# Patient Record
Sex: Female | Born: 1990 | Race: White | Hispanic: No | Marital: Single | Smoking: Former smoker
Health system: Southern US, Community
[De-identification: ages and names within clinical notes are randomized; demographics above are authoritative.]

## PROBLEM LIST (undated history)

## (undated) DIAGNOSIS — F329 Major depressive disorder, single episode, unspecified: Secondary | ICD-10-CM

## (undated) DIAGNOSIS — N200 Calculus of kidney: Secondary | ICD-10-CM

## (undated) DIAGNOSIS — N83519 Torsion of ovary and ovarian pedicle, unspecified side: Secondary | ICD-10-CM

## (undated) DIAGNOSIS — F32A Depression, unspecified: Secondary | ICD-10-CM

## (undated) DIAGNOSIS — N83209 Unspecified ovarian cyst, unspecified side: Secondary | ICD-10-CM

## (undated) HISTORY — PX: LAPAROSCOPY: SHX197

## (undated) HISTORY — PX: OVARIAN CYST DRAINAGE: SHX325

---

## 2010-09-02 DIAGNOSIS — N83209 Unspecified ovarian cyst, unspecified side: Secondary | ICD-10-CM

## 2010-09-02 HISTORY — DX: Unspecified ovarian cyst, unspecified side: N83.209

## 2014-08-27 ENCOUNTER — Emergency Department (HOSPITAL_COMMUNITY)
Admission: EM | Admit: 2014-08-27 | Discharge: 2014-08-27 | Disposition: A | Discharge status: Home / Self Care | Payer: BC Managed Care – PPO | Attending: Emergency Medicine | Admitting: Emergency Medicine

## 2014-08-27 ENCOUNTER — Encounter (HOSPITAL_COMMUNITY): Payer: Self-pay | Admitting: Emergency Medicine

## 2014-08-27 DIAGNOSIS — Z720 Tobacco use: Secondary | ICD-10-CM | POA: Insufficient documentation

## 2014-08-27 DIAGNOSIS — S161XXA Strain of muscle, fascia and tendon at neck level, initial encounter: Secondary | ICD-10-CM | POA: Diagnosis not present

## 2014-08-27 DIAGNOSIS — Y998 Other external cause status: Secondary | ICD-10-CM | POA: Insufficient documentation

## 2014-08-27 DIAGNOSIS — S199XXA Unspecified injury of neck, initial encounter: Secondary | ICD-10-CM | POA: Diagnosis present

## 2014-08-27 DIAGNOSIS — Z8742 Personal history of other diseases of the female genital tract: Secondary | ICD-10-CM | POA: Diagnosis not present

## 2014-08-27 DIAGNOSIS — Y9389 Activity, other specified: Secondary | ICD-10-CM | POA: Diagnosis not present

## 2014-08-27 DIAGNOSIS — Y92410 Unspecified street and highway as the place of occurrence of the external cause: Secondary | ICD-10-CM | POA: Insufficient documentation

## 2014-08-27 DIAGNOSIS — S0990XA Unspecified injury of head, initial encounter: Secondary | ICD-10-CM | POA: Diagnosis not present

## 2014-08-27 HISTORY — DX: Unspecified ovarian cyst, unspecified side: N83.209

## 2014-08-27 MED ORDER — CYCLOBENZAPRINE HCL 10 MG PO TABS: 10.0000 mg | ORAL_TABLET | Freq: Two times a day (BID) | ORAL | Status: DC | PRN

## 2014-08-27 MED ORDER — ONDANSETRON 4 MG PO TBDP
4.0000 mg | ORAL_TABLET | Freq: Once | ORAL | Status: AC
  Administered 2014-08-27: 4 mg via ORAL
  Filled 2014-08-27: qty 1

## 2014-08-27 MED ORDER — IBUPROFEN 800 MG PO TABS
800.0000 mg | ORAL_TABLET | Freq: Three times a day (TID) | ORAL | Status: DC
Start: 2014-08-27 — End: 2016-03-14

## 2014-08-27 MED ORDER — ONDANSETRON HCL 4 MG PO TABS: 4.0000 mg | ORAL_TABLET | Freq: Four times a day (QID) | ORAL | Status: DC
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
## 2014-08-27 NOTE — Discharge Instructions (Signed)
Cervical Sprain °A cervical sprain is when the tissues (ligaments) that hold the neck bones in place stretch or tear. °HOME CARE  °· Put ice on the injured area. °¨ Put ice in a plastic bag. °¨ Place a towel between your skin and the bag. °¨ Leave the ice on for 15-20 minutes, 3-4 times a day. °· You may have been given a collar to wear. This collar keeps your neck from moving while you heal. °¨ Do not take the collar off unless told by your doctor. °¨ If you have long hair, keep it outside of the collar. °¨ Ask your doctor before changing the position of your collar. You may need to change its position over time to make it more comfortable. °¨ If you are allowed to take off the collar for cleaning or bathing, follow your doctor's instructions on how to do it safely. °¨ Keep your collar clean by wiping it with mild soap and water. Dry it completely. If the collar has removable pads, remove them every 1-2 days to hand wash them with soap and water. Allow them to air dry. They should be dry before you wear them in the collar. °¨ Do not drive while wearing the collar. °· Only take medicine as told by your doctor. °· Keep all doctor visits as told. °· Keep all physical therapy visits as told. °· Adjust your work station so that you have good posture while you work. °· Avoid positions and activities that make your problems worse. °· Warm up and stretch before being active. °GET HELP IF: °· Your pain is not controlled with medicine. °· You cannot take less pain medicine over time as planned. °· Your activity level does not improve as expected. °GET HELP RIGHT AWAY IF:  °· You are bleeding. °· Your stomach is upset. °· You have an allergic reaction to your medicine. °· You develop new problems that you cannot explain. °· You lose feeling (become numb) or you cannot move any part of your body (paralysis). °· You have tingling or weakness in any part of your body. °· Your symptoms get worse. Symptoms include: °· Pain,  soreness, stiffness, puffiness (swelling), or a burning feeling in your neck. °· Pain when your neck is touched. °· Shoulder or upper back pain. °· Limited ability to move your neck. °· Headache. °· Dizziness. °· Your hands or arms feel week, lose feeling, or tingle. °· Muscle spasms. °· Difficulty swallowing or chewing. °MAKE SURE YOU:  °· Understand these instructions. °· Will watch your condition. °· Will get help right away if you are not doing well or get worse. °Document Released: 02/05/2008 Document Revised: 04/21/2013 Document Reviewed: 02/24/2013 °ExitCare® Patient Information ©2015 [employer]. This information is not intended to replace advice given to you by your health care provider. Make sure you discuss any questions you have with your health care provider. ° °Motor Vehicle Collision °It is common to have multiple bruises and sore muscles after a motor vehicle collision (MVC). These tend to feel worse for the first 24 hours. You may have the most stiffness and soreness over the first several hours. You may also feel worse when you wake up the first morning after your collision. After this point, you will usually begin to improve with each day. The speed of improvement often depends on the severity of the collision, the number of injuries, and the location and nature of these injuries. °HOME CARE INSTRUCTIONS °· Put ice on the injured area. °¨   Put ice in a plastic bag. °¨ Place a towel between your skin and the bag. °¨ Leave the ice on for 15-20 minutes, 3-4 times a day, or as directed by your health care provider. °· Drink enough fluids to keep your urine clear or pale yellow. Do not drink alcohol. °· Take a warm shower or bath once or twice a day. This will increase blood flow to sore muscles. °· You may return to activities as directed by your caregiver. Be careful when lifting, as this may aggravate neck or back pain. °· Only take over-the-counter or prescription medicines for pain, discomfort,  or fever as directed by your caregiver. Do not use aspirin. This may increase bruising and bleeding. °SEEK IMMEDIATE MEDICAL CARE IF: °· You have numbness, tingling, or weakness in the arms or legs. °· You develop severe headaches not relieved with medicine. °· You have severe neck pain, especially tenderness in the middle of the back of your neck. °· You have changes in bowel or bladder control. °· There is increasing pain in any area of the body. °· You have shortness of breath, light-headedness, dizziness, or fainting. °· You have chest pain. °· You feel sick to your stomach (nauseous), throw up (vomit), or sweat. °· You have increasing abdominal discomfort. °· There is blood in your urine, stool, or vomit. °· You have pain in your shoulder (shoulder strap areas). °· You feel your symptoms are getting worse. °MAKE SURE YOU: °· Understand these instructions. °· Will watch your condition. °· Will get help right away if you are not doing well or get worse. °Document Released: 08/19/2005 Document Revised: 01/03/2014 Document Reviewed: 01/16/2011 °ExitCare® Patient Information ©2015 [employer]. This information is not intended to replace advice given to you by your health care provider. Make sure you discuss any questions you have with your health care provider. ° °

## 2015-11-11 ENCOUNTER — Encounter (HOSPITAL_COMMUNITY): Payer: Self-pay | Admitting: Oncology

## 2015-11-11 ENCOUNTER — Emergency Department (HOSPITAL_COMMUNITY): Payer: 59

## 2015-11-11 ENCOUNTER — Emergency Department (HOSPITAL_COMMUNITY)
Admission: EM | Admit: 2015-11-11 | Discharge: 2015-11-11 | Disposition: A | Discharge status: Home / Self Care | Payer: 59 | Attending: Emergency Medicine | Admitting: Emergency Medicine

## 2015-11-11 DIAGNOSIS — N200 Calculus of kidney: Secondary | ICD-10-CM | POA: Diagnosis not present

## 2015-11-11 DIAGNOSIS — N83201 Unspecified ovarian cyst, right side: Secondary | ICD-10-CM | POA: Diagnosis not present

## 2015-11-11 DIAGNOSIS — R1031 Right lower quadrant pain: Secondary | ICD-10-CM | POA: Diagnosis present

## 2015-11-11 DIAGNOSIS — R102 Pelvic and perineal pain unspecified side: Secondary | ICD-10-CM

## 2015-11-11 DIAGNOSIS — F17200 Nicotine dependence, unspecified, uncomplicated: Secondary | ICD-10-CM | POA: Insufficient documentation

## 2015-11-11 DIAGNOSIS — Z3202 Encounter for pregnancy test, result negative: Secondary | ICD-10-CM | POA: Diagnosis not present

## 2015-11-11 LAB — CBC
HEMATOCRIT: 41.4 % (ref 36.0–46.0)
Hemoglobin: 14.2 g/dL (ref 12.0–15.0)
MCH: 31.3 pg (ref 26.0–34.0)
MCHC: 34.3 g/dL (ref 30.0–36.0)
MCV: 91.2 fL (ref 78.0–100.0)
Platelets: 264 10*3/uL (ref 150–400)
RBC: 4.54 MIL/uL (ref 3.87–5.11)
RDW: 12.1 % (ref 11.5–15.5)
WBC: 7.8 10*3/uL (ref 4.0–10.5)

## 2015-11-11 LAB — COMPREHENSIVE METABOLIC PANEL
ALBUMIN: 4.4 g/dL (ref 3.5–5.0)
ALT: 20 U/L (ref 14–54)
AST: 21 U/L (ref 15–41)
Alkaline Phosphatase: 42 U/L (ref 38–126)
Anion gap: 10 (ref 5–15)
BILIRUBIN TOTAL: 0.7 mg/dL (ref 0.3–1.2)
BUN: 16 mg/dL (ref 6–20)
CHLORIDE: 105 mmol/L (ref 101–111)
CO2: 22 mmol/L (ref 22–32)
Calcium: 9.9 mg/dL (ref 8.9–10.3)
Creatinine, Ser: 0.9 mg/dL (ref 0.44–1.00)
GFR calc Af Amer: >60 mL/min (ref >60)
GFR calc non Af Amer: >60 mL/min (ref >60)
GLUCOSE: 78 mg/dL (ref 65–99)
POTASSIUM: 3.6 mmol/L (ref 3.5–5.1)
Sodium: 137 mmol/L (ref 135–145)
Total Protein: 8 g/dL (ref 6.5–8.1)

## 2015-11-11 LAB — URINALYSIS, ROUTINE W REFLEX MICROSCOPIC
BILIRUBIN URINE: NEGATIVE
GLUCOSE, UA: NEGATIVE mg/dL
Ketones, ur: NEGATIVE mg/dL
Nitrite: NEGATIVE
PH: 6.5 (ref 5.0–8.0)
Protein, ur: 30 mg/dL — AB
Specific Gravity, Urine: 1.03 (ref 1.005–1.030)

## 2015-11-11 LAB — URINE MICROSCOPIC-ADD ON

## 2015-11-11 LAB — LIPASE, BLOOD: Lipase: 42 U/L (ref 11–51)

## 2015-11-11 LAB — PREGNANCY, URINE: Preg Test, Ur: NEGATIVE

## 2015-11-11 MED ORDER — KETOROLAC TROMETHAMINE 30 MG/ML IJ SOLN
30.0000 mg | Freq: Once | INTRAMUSCULAR | Status: AC
  Administered 2015-11-11: 30 mg via INTRAVENOUS
  Filled 2015-11-11: qty 1

## 2015-11-11 MED ORDER — HYDROMORPHONE HCL 1 MG/ML IJ SOLN
1.0000 mg | Freq: Once | INTRAMUSCULAR | Status: AC
  Administered 2015-11-11: 1 mg via INTRAVENOUS
  Filled 2015-11-11: qty 1

## 2015-11-11 MED ORDER — HYDROMORPHONE HCL 1 MG/ML IJ SOLN
1.0000 mg | Freq: Once | INTRAMUSCULAR | Status: DC
  Filled 2015-11-11: qty 1

## 2015-11-11 MED ORDER — ONDANSETRON HCL 4 MG/2ML IJ SOLN
4.0000 mg | Freq: Once | INTRAMUSCULAR | Status: AC
  Administered 2015-11-11: 4 mg via INTRAVENOUS
  Filled 2015-11-11: qty 2

## 2015-11-11 MED ORDER — OXYCODONE-ACETAMINOPHEN 5-325 MG PO TABS: 2.0000 | ORAL_TABLET | ORAL | Status: DC | PRN

## 2015-11-11 MED ORDER — ONDANSETRON 4 MG PO TBDP: 4.0000 mg | ORAL_TABLET | Freq: Three times a day (TID) | ORAL | Status: DC | PRN

## 2015-11-11 MED ORDER — HYDROMORPHONE HCL 1 MG/ML IJ SOLN
1.0000 mg | Freq: Once | INTRAMUSCULAR | Status: AC
  Administered 2015-11-11: 1 mg via INTRAVENOUS

## 2015-11-11 MED ORDER — TAMSULOSIN HCL 0.4 MG PO CAPS: 0.4000 mg | ORAL_CAPSULE | Freq: Every day | ORAL | Status: DC

## 2015-11-11 MED ORDER — IOHEXOL 300 MG/ML  SOLN
100.0000 mL | Freq: Once | INTRAMUSCULAR | Status: AC | PRN
  Administered 2015-11-11: 100 mL via INTRAVENOUS

## 2015-11-11 MED ORDER — ONDANSETRON HCL 4 MG/2ML IJ SOLN
4.0000 mg | Freq: Once | INTRAMUSCULAR | Status: AC
  Administered 2015-11-11: 4 mg via INTRAMUSCULAR
  Filled 2015-11-11: qty 2

## 2015-11-11 MED ORDER — IOHEXOL 300 MG/ML  SOLN
50.0000 mL | Freq: Once | INTRAMUSCULAR | Status: AC | PRN
  Administered 2015-11-11: 50 mL via ORAL

## 2015-11-11 NOTE — ED Provider Notes (Signed)
CSN: [id number]     Arrival date & time 11/11/15  0250 History   First MD Initiated Contact with Patient 11/11/15 [phone]     Chief Complaint  Patient presents with  . Abdominal Pain     (Consider location/radiation/quality/duration/timing/severity/associated sxs/prior Treatment) Patient is a 25 y.o. female presenting with abdominal pain. The history is provided by the patient. No language interpreter was used.  Abdominal Pain Pain location:  RLQ Pain quality: aching and stabbing   Pain radiates to:  RLQ Pain severity:  Severe Onset quality:  Sudden Timing:  Constant Progression:  Worsening Chronicity:  New Context: alcohol use and previous surgery   Relieved by:  Nothing Worsened by:  Nothing tried Ineffective treatments:  None tried Risk factors: no recent hospitalization   Pt reports she has had an ovarain torsion in the past and pain is similiar  Past Medical History  Diagnosis Date  . Ovarian cyst 2012   History reviewed. No pertinent past surgical history. No family history on file. Social History  Substance Use Topics  . Smoking status: Current Some Day Smoker  . Smokeless tobacco: Never Used  . Alcohol Use: Yes   OB History    No data available     Review of Systems  Gastrointestinal: Positive for abdominal pain.  All other systems reviewed and are negative.     Allergies  Review of patient's allergies indicates no known allergies.  Home Medications   Prior to Admission medications   Medication Sig Start Date End Date Taking? Authorizing Provider  cyclobenzaprine (FLEXERIL) 10 MG tablet Take 1 tablet (10 mg total) by mouth 2 (two) times daily as needed for muscle spasms. Patient not taking: Reported on 11/11/2015 08/27/14   Elpidio Anis, PA-C  ibuprofen (ADVIL,MOTRIN) 800 MG tablet Take 1 tablet (800 mg total) by mouth 3 (three) times daily. Patient not taking: Reported on 11/11/2015 08/27/14   Elpidio Anis, PA-C  ondansetron (ZOFRAN) 4 MG tablet Take  1 tablet (4 mg total) by mouth every 6 (six) hours. Patient not taking: Reported on 11/11/2015 08/27/14   Elpidio Anis, PA-C   BP 123/70 mmHg  Pulse 76  Temp(Src) 98.4 F (36.9 C) (Oral)  Resp 20  Ht  (1.626 m)  Wt 52.164 kg  BMI 19.73 kg/m2  SpO2 100%  LMP 10/21/2015 (Approximate) Physical Exam  Constitutional: She is oriented to person, place, and time. She appears well-developed and well-nourished.  HENT:  Head: Normocephalic and atraumatic.  Right Ear: External ear normal.  Left Ear: External ear normal.  Mouth/Throat: Oropharynx is clear and moist.  Eyes: Conjunctivae and EOM are normal. Pupils are equal, round, and reactive to light.  Neck: Normal range of motion.  Cardiovascular: Normal rate and normal heart sounds.   Pulmonary/Chest: Effort normal.  Abdominal: Soft. She exhibits no distension.  Musculoskeletal: Normal range of motion.  Neurological: She is alert and oriented to person, place, and time.  Skin: Skin is warm.  Psychiatric: She has a normal mood and affect.  Nursing note and vitals reviewed.   ED Course  Procedures (including critical care time) Labs Review Labs Reviewed  URINALYSIS, ROUTINE W REFLEX MICROSCOPIC (NOT AT [facility]) - Abnormal; Notable for the following:    APPearance CLOUDY (*)    Hgb urine dipstick SMALL (*)    Protein, ur 30 (*)    Leukocytes, UA SMALL (*)    All other components within normal limits  URINE MICROSCOPIC-ADD ON - Abnormal; Notable for the following:  Squamous Epithelial / LPF 6-30 (*)    Bacteria, UA MANY (*)    All other components within normal limits  LIPASE, BLOOD  COMPREHENSIVE METABOLIC PANEL  CBC  PREGNANCY, URINE    Imaging Review US Transvaginal Non-ob  11/11/2015  CLINICAL DATA:  Right lower quadrant pain. History of torsion and cysts. EXAM: TRANSABDOMINAL AND TRANSVAGINAL ULTRASOUND OF PELVIS DOPPLER ULTRASOUND OF OVARIES TECHNIQUE: Both transabdominal and transvaginal ultrasound examinations of  the pelvis were performed. Transabdominal technique was performed for global imaging of the pelvis including uterus, ovaries, adnexal regions, and pelvic cul-de-sac. It was necessary to proceed with endovaginal exam following the transabdominal exam to visualize the ovaries. Color and duplex Doppler ultrasound was utilized to evaluate blood flow to the ovaries. COMPARISON:  None. FINDINGS: Uterus Measurements: 6 x 3 x 5 cm. No fibroids or other mass visualized. Endometrium Thickness: 10 mm.  No focal abnormality visualized. Right ovary Measurements: 45 x 28 x 34 mm. Crenulated/collapsed cystic structure measuring 14 mm with mid level internal echoes. Left ovary Measurements: 23 x 20 x 22 mm. Normal appearance/no adnexal mass. Pulsed Doppler evaluation of both ovaries demonstrates normal low-resistance arterial and venous waveforms. Other findings Small pelvic fluid, mainly simple. IMPRESSION: 1. 14 mm hemorrhagic cyst/corpus luteum on the right. Partially collapsed appearance may indicate recent rupture. 2. Negative for ovarian torsion. Electronically Signed   By: Marnee Spring M.D.   On: 11/11/2015 04:55   US Pelvis Complete  11/11/2015  CLINICAL DATA:  Right lower quadrant pain. History of torsion and cysts. EXAM: TRANSABDOMINAL AND TRANSVAGINAL ULTRASOUND OF PELVIS DOPPLER ULTRASOUND OF OVARIES TECHNIQUE: Both transabdominal and transvaginal ultrasound examinations of the pelvis were performed. Transabdominal technique was performed for global imaging of the pelvis including uterus, ovaries, adnexal regions, and pelvic cul-de-sac. It was necessary to proceed with endovaginal exam following the transabdominal exam to visualize the ovaries. Color and duplex Doppler ultrasound was utilized to evaluate blood flow to the ovaries. COMPARISON:  None. FINDINGS: Uterus Measurements: 6 x 3 x 5 cm. No fibroids or other mass visualized. Endometrium Thickness: 10 mm.  No focal abnormality visualized. Right ovary  Measurements: 45 x 28 x 34 mm. Crenulated/collapsed cystic structure measuring 14 mm with mid level internal echoes. Left ovary Measurements: 23 x 20 x 22 mm. Normal appearance/no adnexal mass. Pulsed Doppler evaluation of both ovaries demonstrates normal low-resistance arterial and venous waveforms. Other findings Small pelvic fluid, mainly simple. IMPRESSION: 1. 14 mm hemorrhagic cyst/corpus luteum on the right. Partially collapsed appearance may indicate recent rupture. 2. Negative for ovarian torsion. Electronically Signed   By: Marnee Spring M.D.   On: 11/11/2015 04:55   Ct Abdomen Pelvis W Contrast  11/11/2015  CLINICAL DATA:  Right lower quadrant pain EXAM: CT ABDOMEN AND PELVIS WITH CONTRAST TECHNIQUE: Multidetector CT imaging of the abdomen and pelvis was performed using the standard protocol following bolus administration of intravenous contrast. CONTRAST:  50mL OMNIPAQUE IOHEXOL 300 MG/ML SOLN, OMNIPAQUE IOHEXOL 300 MG/ML SOLN COMPARISON:  None. FINDINGS: Lower chest and abdominal wall:  No contributory findings. Hepatobiliary: No focal liver abnormality.No evidence of biliary obstruction or stone. Pancreas: Unremarkable. Spleen: Unremarkable. Adrenals/Urinary Tract:  Negative adrenals. 2 mm stone at the right UVJ with mild hydronephrosis. 2 left renal calculi up to 4 mm. No left hydronephrosis. Unremarkable bladder. Reproductive:Right corpus luteum as reported on prior sonography. Stomach/Bowel: No obstruction. Appendix not well seen but no secondary signs of appendicitis. Vascular/Lymphatic: No acute vascular abnormality. No mass or adenopathy. Peritoneal: No  ascites or pneumoperitoneum. Musculoskeletal: No acute abnormalities. IMPRESSION: 1. 2 mm right UVJ calculus with mild hydronephrosis. 2. Left nephrolithiasis. Electronically Signed   By: Marnee Spring M.D.   On: 11/11/2015 06:29   [place] Art/ven Flow Abd Pelv Doppler  11/11/2015  CLINICAL DATA:  Right lower quadrant pain. History of  torsion and cysts. EXAM: TRANSABDOMINAL AND TRANSVAGINAL ULTRASOUND OF PELVIS DOPPLER ULTRASOUND OF OVARIES TECHNIQUE: Both transabdominal and transvaginal ultrasound examinations of the pelvis were performed. Transabdominal technique was performed for global imaging of the pelvis including uterus, ovaries, adnexal regions, and pelvic cul-de-sac. It was necessary to proceed with endovaginal exam following the transabdominal exam to visualize the ovaries. Color and duplex Doppler ultrasound was utilized to evaluate blood flow to the ovaries. COMPARISON:  None. FINDINGS: Uterus Measurements: 6 x 3 x 5 cm. No fibroids or other mass visualized. Endometrium Thickness: 10 mm.  No focal abnormality visualized. Right ovary Measurements: 45 x 28 x 34 mm. Crenulated/collapsed cystic structure measuring 14 mm with mid level internal echoes. Left ovary Measurements: 23 x 20 x 22 mm. Normal appearance/no adnexal mass. Pulsed Doppler evaluation of both ovaries demonstrates normal low-resistance arterial and venous waveforms. Other findings Small pelvic fluid, mainly simple. IMPRESSION: 1. 14 mm hemorrhagic cyst/corpus luteum on the right. Partially collapsed appearance may indicate recent rupture. 2. Negative for ovarian torsion. Electronically Signed   By: Marnee Spring M.D.   On: 11/11/2015 04:55   I have personally reviewed and evaluated these images and lab results as part of my medical decision-making.   EKG Interpretation None      MDM ultrasound shows small collapsing cyst.   Ct abdomen ordered to evaluate for possible appendicitis.  Pt's care turned over to [facility].  Ct scan pending   Final diagnoses:  Kidney stone on right side  Ovarian cyst, right   An After Visit Summary was printed and given to the patient. Meds ordered this encounter  Medications  . HYDROmorphone (DILAUDID) injection 1 mg    Sig:   . ondansetron (ZOFRAN) injection 4 mg    Sig:   . DISCONTD: HYDROmorphone (DILAUDID)  injection 1 mg    Sig:   . HYDROmorphone (DILAUDID) injection 1 mg    Sig:   . iohexol (OMNIPAQUE) 300 MG/ML solution 50 mL    Sig:   . iohexol (OMNIPAQUE) 300 MG/ML solution 100 mL    Sig:   . HYDROmorphone (DILAUDID) injection 1 mg    Sig:   . ondansetron (ZOFRAN) injection 4 mg    Sig:   . ketorolac (TORADOL) 30 MG/ML injection 30 mg    Sig:   . oxyCODONE-acetaminophen (PERCOCET/ROXICET) 5-325 MG tablet    Sig: Take 2 tablets by mouth every 4 (four) hours as needed for severe pain.    Dispense:  15 tablet    Refill:  0    Order Specific Question:  Supervising Provider    Answer:  MILLER, BRIAN [[id number]]  . tamsulosin (FLOMAX) 0.4 MG CAPS capsule    Sig: Take 1 capsule (0.4 mg total) by mouth daily.    Dispense:  15 capsule    Refill:  0    Order Specific Question:  Supervising Provider    Answer:  MILLER, BRIAN [[id number]]  . ondansetron (ZOFRAN ODT) 4 MG disintegrating tablet    Sig: Take 1 tablet (4 mg total) by mouth every 8 (eight) hours as needed for nausea or vomiting.    Dispense:  20 tablet    Refill:  0    Order Specific Question:  Supervising Provider    Answer:  Eber HongMILLER, BRIAN [[id number]]     Lonia SkinnerLeslie K HillcrestSofia, PA-C 11/11/15 [phone]  April Palumbo, MD 11/11/15 [phone]

## 2015-11-11 NOTE — Discharge Instructions (Signed)
Ovarian Cyst An ovarian cyst is a fluid-filled sac that forms on an ovary. The ovaries are small organs that produce eggs in women. Various types of cysts can form on the ovaries. Most are not cancerous. Many do not cause problems, and they often go away on their own. Some may cause symptoms and require treatment. Common types of ovarian cysts include:  Functional cysts--These cysts may occur every month during the menstrual cycle. This is normal. The cysts usually go away with the next menstrual cycle if the woman does not get pregnant. Usually, there are no symptoms with a functional cyst.  Endometrioma cysts--These cysts form from the tissue that lines the uterus. They are also called "chocolate cysts" because they become filled with blood that turns brown. This type of cyst can cause pain in the lower abdomen during intercourse and with your menstrual period.  Cystadenoma cysts--This type develops from the cells on the outside of the ovary. These cysts can get very big and cause lower abdomen pain and pain with intercourse. This type of cyst can twist on itself, cut off its blood supply, and cause severe pain. It can also easily rupture and cause a lot of pain.  Dermoid cysts--This type of cyst is sometimes found in both ovaries. These cysts may contain different kinds of body tissue, such as skin, teeth, hair, or cartilage. They usually do not cause symptoms unless they get very big.  Theca lutein cysts--These cysts occur when too much of a certain hormone (human chorionic gonadotropin) is produced and overstimulates the ovaries to produce an egg. This is most common after procedures used to assist with the conception of a baby (in vitro fertilization). CAUSES   Fertility drugs can cause a condition in which multiple large cysts are formed on the ovaries. This is called ovarian hyperstimulation syndrome.  A condition called polycystic ovary syndrome can cause hormonal imbalances that can lead to  nonfunctional ovarian cysts. SIGNS AND SYMPTOMS  Many ovarian cysts do not cause symptoms. If symptoms are present, they may include:  Pelvic pain or pressure.  Pain in the lower abdomen.  Pain during sexual intercourse.  Increasing girth (swelling) of the abdomen.  Abnormal menstrual periods.  Increasing pain with menstrual periods.  Stopping having menstrual periods without being pregnant. DIAGNOSIS  These cysts are commonly found during a routine or annual pelvic exam. Tests may be ordered to find out more about the cyst. These tests may include:  Ultrasound.  X-ray of the pelvis.  CT scan.  MRI.  Blood tests. TREATMENT  Many ovarian cysts go away on their own without treatment. Your health care provider may want to check your cyst regularly for 2-3 months to see if it changes. For women in menopause, it is particularly important to monitor a cyst closely because of the higher rate of ovarian cancer in menopausal women. When treatment is needed, it may include any of the following:  A procedure to drain the cyst (aspiration). This may be done using a long needle and ultrasound. It can also be done through a laparoscopic procedure. This involves using a thin, lighted tube with a tiny camera on the end (laparoscope) inserted through a small incision.  Surgery to remove the whole cyst. This may be done using laparoscopic surgery or an open surgery involving a larger incision in the lower abdomen.  Hormone treatment or birth control pills. These methods are sometimes used to help dissolve a cyst. HOME CARE INSTRUCTIONS   Only take over-the-counter  or prescription medicines as directed by your health care provider.  Follow up with your health care provider as directed.  Get regular pelvic exams and Pap tests. SEEK MEDICAL CARE IF:   Your periods are late, irregular, or painful, or they stop.  Your pelvic pain or abdominal pain does not go away.  Your abdomen becomes  larger or swollen.  You have pressure on your bladder or trouble emptying your bladder completely.  You have pain during sexual intercourse.  You have feelings of fullness, pressure, or discomfort in your stomach.  You lose weight for no apparent reason.  You feel generally ill.  You become constipated.  You lose your appetite.  You develop acne.  You have an increase in body and facial hair.  You are gaining weight, without changing your exercise and eating habits.  You think you are pregnant. SEEK IMMEDIATE MEDICAL CARE IF:   You have increasing abdominal pain.  You feel sick to your stomach (nauseous), and you throw up (vomit).  You develop a fever that comes on suddenly.  You have abdominal pain during a bowel movement.  Your menstrual periods become heavier than usual. MAKE SURE YOU:  Understand these instructions.  Will watch your condition.  Will get help right away if you are not doing well or get worse.   This information is not intended to replace advice given to you by your health care provider. Make sure you discuss any questions you have with your health care provider.   Document Released: 08/19/2005 Document Revised: 08/24/2013 Document Reviewed: 04/26/2013 Elsevier Interactive Patient Education 2016 [employer] Kidney Stones Kidney stones (urolithiasis) are deposits that form inside your kidneys. The intense pain is caused by the stone moving through the urinary tract. When the stone moves, the ureter goes into spasm around the stone. The stone is usually passed in the urine.  CAUSES   A disorder that makes certain neck glands produce too much parathyroid hormone (primary hyperparathyroidism).  A buildup of uric acid crystals, similar to gout in your joints.  Narrowing (stricture) of the ureter.  A kidney obstruction present at birth (congenital obstruction).  Previous surgery on the kidney or ureters.  Numerous kidney  infections. SYMPTOMS   Feeling sick to your stomach (nauseous).  Throwing up (vomiting).  Blood in the urine (hematuria).  Pain that usually spreads (radiates) to the groin.  Frequency or urgency of urination. DIAGNOSIS   Taking a history and physical exam.  Blood or urine tests.  CT scan.  Occasionally, an examination of the inside of the urinary bladder (cystoscopy) is performed. TREATMENT   Observation.  Increasing your fluid intake.  Extracorporeal shock wave lithotripsy--This is a noninvasive procedure that uses shock waves to break up kidney stones.  Surgery may be needed if you have severe pain or persistent obstruction. There are various surgical procedures. Most of the procedures are performed with the use of small instruments. Only small incisions are needed to accommodate these instruments, so recovery time is minimized. The size, location, and chemical composition are all important variables that will determine the proper choice of action for you. Talk to your health care provider to better understand your situation so that you will minimize the risk of injury to yourself and your kidney.  HOME CARE INSTRUCTIONS   Drink enough water and fluids to keep your urine clear or pale yellow. This will help you to pass the stone or stone fragments.  Strain all urine through the provided strainer. Keep  all particulate matter and stones for your health care provider to see. The stone causing the pain may be as small as a grain of salt. It is very important to use the strainer each and every time you pass your urine. The collection of your stone will allow your health care provider to analyze it and verify that a stone has actually passed. The stone analysis will often identify what you can do to reduce the incidence of recurrences.  Only take over-the-counter or prescription medicines for pain, discomfort, or fever as directed by your health care provider.  Keep all follow-up  visits as told by your health care provider. This is important.  Get follow-up X-rays if required. The absence of pain does not always mean that the stone has passed. It may have only stopped moving. If the urine remains completely obstructed, it can cause loss of kidney function or even complete destruction of the kidney. It is your responsibility to make sure X-rays and follow-ups are completed. Ultrasounds of the kidney can show blockages and the status of the kidney. Ultrasounds are not associated with any radiation and can be performed easily in a matter of minutes.  Make changes to your daily diet as told by your health care provider. You may be told to:  Limit the amount of salt that you eat.  Eat 5 or more servings of fruits and vegetables each day.  Limit the amount of meat, poultry, fish, and eggs that you eat.  Collect a 24-hour urine sample as told by your health care provider.You may need to collect another urine sample every 6-12 months. SEEK MEDICAL CARE IF:  You experience pain that is progressive and unresponsive to any pain medicine you have been prescribed. SEEK IMMEDIATE MEDICAL CARE IF:   Pain cannot be controlled with the prescribed medicine.  You have a fever or shaking chills.  The severity or intensity of pain increases over 18 hours and is not relieved by pain medicine.  You develop a new onset of abdominal pain.  You feel faint or pass out.  You are unable to urinate.   This information is not intended to replace advice given to you by your health care provider. Make sure you discuss any questions you have with your health care provider.   Document Released: 08/19/2005 Document Revised: 05/10/2015 Document Reviewed: 01/20/2013 Elsevier Interactive Patient Education [employer]

## 2015-11-11 NOTE — ED Notes (Signed)
Pt c/o RLQ abdominal pain.  Pt w/ known hx of ovarian cysts.  Pt rates pain 8/10, sharp intermittently, dull aching constantly.

## 2015-11-11 NOTE — ED Notes (Signed)
Ultrasound completed

## 2015-11-11 NOTE — ED Notes (Signed)
Patient transported to CT 

## 2016-03-14 ENCOUNTER — Encounter (HOSPITAL_COMMUNITY): Payer: Self-pay | Admitting: Emergency Medicine

## 2016-03-14 ENCOUNTER — Emergency Department (HOSPITAL_COMMUNITY): Payer: No Typology Code available for payment source

## 2016-03-14 ENCOUNTER — Emergency Department (HOSPITAL_COMMUNITY)
Admission: EM | Admit: 2016-03-14 | Discharge: 2016-03-14 | Disposition: A | Discharge status: Home / Self Care | Payer: No Typology Code available for payment source | Attending: Emergency Medicine | Admitting: Emergency Medicine

## 2016-03-14 DIAGNOSIS — Y9289 Other specified places as the place of occurrence of the external cause: Secondary | ICD-10-CM | POA: Insufficient documentation

## 2016-03-14 DIAGNOSIS — W01198A Fall on same level from slipping, tripping and stumbling with subsequent striking against other object, initial encounter: Secondary | ICD-10-CM | POA: Insufficient documentation

## 2016-03-14 DIAGNOSIS — F17200 Nicotine dependence, unspecified, uncomplicated: Secondary | ICD-10-CM | POA: Diagnosis not present

## 2016-03-14 DIAGNOSIS — R55 Syncope and collapse: Secondary | ICD-10-CM | POA: Diagnosis present

## 2016-03-14 DIAGNOSIS — S060X1A Concussion with loss of consciousness of 30 minutes or less, initial encounter: Secondary | ICD-10-CM | POA: Diagnosis not present

## 2016-03-14 DIAGNOSIS — Y999 Unspecified external cause status: Secondary | ICD-10-CM | POA: Diagnosis not present

## 2016-03-14 DIAGNOSIS — Y939 Activity, unspecified: Secondary | ICD-10-CM | POA: Insufficient documentation

## 2016-03-14 DIAGNOSIS — H7292 Unspecified perforation of tympanic membrane, left ear: Secondary | ICD-10-CM | POA: Insufficient documentation

## 2016-03-14 DIAGNOSIS — Z79899 Other long term (current) drug therapy: Secondary | ICD-10-CM | POA: Insufficient documentation

## 2016-03-14 HISTORY — DX: Calculus of kidney: N20.0

## 2016-03-14 MED ORDER — APAP 325 MG PO TABS: 650.0000 mg | ORAL_TABLET | Freq: Four times a day (QID) | ORAL | Status: DC | PRN

## 2016-03-14 MED ORDER — ONDANSETRON 4 MG PO TBDP: 4.0000 mg | ORAL_TABLET | Freq: Three times a day (TID) | ORAL | Status: DC | PRN

## 2016-03-14 MED ORDER — ONDANSETRON 4 MG PO TBDP
4.0000 mg | ORAL_TABLET | Freq: Once | ORAL | Status: AC
  Administered 2016-03-14: 4 mg via ORAL
  Filled 2016-03-14: qty 1

## 2016-03-14 MED ORDER — NAPROXEN 250 MG PO TABS: 250.0000 mg | ORAL_TABLET | Freq: Two times a day (BID) | ORAL | Status: DC

## 2016-03-14 MED ORDER — ACETAMINOPHEN 325 MG PO TABS
650.0000 mg | ORAL_TABLET | Freq: Once | ORAL | Status: AC
  Administered 2016-03-14: 650 mg via ORAL
  Filled 2016-03-14: qty 2

## 2016-03-14 NOTE — ED Notes (Signed)
MD at bedside. 

## 2016-03-14 NOTE — ED Notes (Signed)
[facility] was [facility] here [facility]. Pt had fall last night hitting the left side of her face/head with positive LOC. MD at [facility] concerned for perforated TM. Pt also reports nausea since this morning and left sided HA. Pt a/o x 4.

## 2016-03-14 NOTE — Discharge Instructions (Signed)
Concussion, Adult A concussion, or closed-head injury, is a brain injury caused by a direct blow to the head or by a quick and sudden movement (jolt) of the head or neck. Concussions are usually not life-threatening. Even so, the effects of a concussion can be serious. If you have had a concussion before, you are more likely to experience concussion-like symptoms after a direct blow to the head.  CAUSES  Direct blow to the head, such as from running into another player during a soccer game, being hit in a fight, or hitting your head on a hard surface.  A jolt of the head or neck that causes the brain to move back and forth inside the skull, such as in a car crash. SIGNS AND SYMPTOMS The signs of a concussion can be hard to notice. Early on, they may be missed by you, family members, and health care providers. You may look fine but act or feel differently. Symptoms are usually temporary, but they may last for days, weeks, or even longer. Some symptoms may appear right away while others may not show up for hours or days. Every head injury is different. Symptoms include:  Mild to moderate headaches that will not go away.  A feeling of pressure inside your head.  Having more trouble than usual:  Learning or remembering things you have heard.  Answering questions.  Paying attention or concentrating.  Organizing daily tasks.  Making decisions and solving problems.  Slowness in thinking, acting or reacting, speaking, or reading.  Getting lost or being easily confused.  Feeling tired all the time or lacking energy (fatigued).  Feeling drowsy.  Sleep disturbances.  Sleeping more than usual.  Sleeping less than usual.  Trouble falling asleep.  Trouble sleeping (insomnia).  Loss of balance or feeling lightheaded or dizzy.  Nausea or vomiting.  Numbness or tingling.  Increased sensitivity to:  Sounds.  Lights.  Distractions.  Vision problems or eyes that tire  easily.  Diminished sense of taste or smell.  Ringing in the ears.  Mood changes such as feeling sad or anxious.  Becoming easily irritated or angry for little or no reason.  Lack of motivation.  Seeing or hearing things other people do not see or hear (hallucinations). DIAGNOSIS Your health care provider can usually diagnose a concussion based on a description of your injury and symptoms. He or she will ask whether you passed out (lost consciousness) and whether you are having trouble remembering events that happened right before and during your injury. Your evaluation might include:  A brain scan to look for signs of injury to the brain. Even if the test shows no injury, you may still have a concussion.  Blood tests to be sure other problems are not present. TREATMENT  Concussions are usually treated in an emergency department, in urgent care, or at a clinic. You may need to stay in the hospital overnight for further treatment.  Tell your health care provider if you are taking any medicines, including prescription medicines, over-the-counter medicines, and natural remedies. Some medicines, such as blood thinners (anticoagulants) and aspirin, may increase the chance of complications. Also tell your health care provider whether you have had alcohol or are taking illegal drugs. This information may affect treatment.  Your health care provider will send you home with important instructions to follow.  How fast you will recover from a concussion depends on many factors. These factors include how severe your concussion is, what part of your brain was injured,  your age, and how healthy you were before the concussion.  Most people with mild injuries recover fully. Recovery can take time. In general, recovery is slower in older persons. Also, persons who have had a concussion in the past or have other medical problems may find that it takes longer to recover from their current injury. HOME  CARE INSTRUCTIONS General Instructions  Carefully follow the directions your health care provider gave you.  Only take over-the-counter or prescription medicines for pain, discomfort, or fever as directed by your health care provider.  Take only those medicines that your health care provider has approved.  Do not drink alcohol until your health care provider says you are well enough to do so. Alcohol and certain other drugs may slow your recovery and can put you at risk of further injury.  If it is harder than usual to remember things, write them down.  If you are easily distracted, try to do one thing at a time. For example, do not try to watch TV while fixing dinner.  Talk with family members or close friends when making important decisions.  Keep all follow-up appointments. Repeated evaluation of your symptoms is recommended for your recovery.  Watch your symptoms and tell others to do the same. Complications sometimes occur after a concussion. Older adults with a brain injury may have a higher risk of serious complications, such as a blood clot on the brain.  Tell your teachers, school nurse, school counselor, coach, athletic trainer, or work Freight forwarder about your injury, symptoms, and restrictions. Tell them about what you can or cannot do. They should watch for:  Increased problems with attention or concentration.  Increased difficulty remembering or learning new information.  Increased time needed to complete tasks or assignments.  Increased irritability or decreased ability to cope with stress.  Increased symptoms.  Rest. Rest helps the brain to heal. Make sure you:  Get plenty of sleep at night. Avoid staying up late at night.  Keep the same bedtime hours on weekends and weekdays.  Rest during the day. Take daytime naps or rest breaks when you feel tired.  Limit activities that require a lot of thought or concentration. These include:  Doing homework or job-related  work.  Watching TV.  Working on the computer.  Avoid any situation where there is potential for another head injury (football, hockey, soccer, basketball, martial arts, downhill snow sports and horseback riding). Your condition will get worse every time you experience a concussion. You should avoid these activities until you are evaluated by the appropriate follow-up health care providers. Returning To Your Regular Activities You will need to return to your normal activities slowly, not all at once. You must give your body and brain enough time for recovery.  Do not return to sports or other athletic activities until your health care provider tells you it is safe to do so.  Ask your health care provider when you can drive, ride a bicycle, or operate heavy machinery. Your ability to react may be slower after a brain injury. Never do these activities if you are dizzy.  Ask your health care provider about when you can return to work or school. Preventing Another Concussion It is very important to avoid another brain injury, especially before you have recovered. In rare cases, another injury can lead to permanent brain damage, brain swelling, or death. The risk of this is greatest during the first 7-10 days after a head injury. Avoid injuries by:  Wearing a  seat belt when riding in a car.  Drinking alcohol only in moderation.  Wearing a helmet when biking, skiing, skateboarding, skating, or doing similar activities.  Avoiding activities that could lead to a second concussion, such as contact or recreational sports, until your health care provider says it is okay.  Taking safety measures in your home.  Remove clutter and tripping hazards from floors and stairways.  Use grab bars in bathrooms and handrails by stairs.  Place non-slip mats on floors and in bathtubs.  Improve lighting in dim areas. SEEK MEDICAL CARE IF:  You have increased problems paying attention or  concentrating.  You have increased difficulty remembering or learning new information.  You need more time to complete tasks or assignments than before.  You have increased irritability or decreased ability to cope with stress.  You have more symptoms than before. Seek medical care if you have any of the following symptoms for more than 2 weeks after your injury:  Lasting (chronic) headaches.  Dizziness or balance problems.  Nausea.  Vision problems.  Increased sensitivity to noise or light.  Depression or mood swings.  Anxiety or irritability.  Memory problems.  Difficulty concentrating or paying attention.  Sleep problems.  Feeling tired all the time. SEEK IMMEDIATE MEDICAL CARE IF:  You have severe or worsening headaches. These may be a sign of a blood clot in the brain.  You have weakness (even if only in one hand, leg, or part of the face).  You have numbness.  You have decreased coordination.  You vomit repeatedly.  You have increased sleepiness.  One pupil is larger than the other.  You have convulsions.  You have slurred speech.  You have increased confusion. This may be a sign of a blood clot in the brain.  You have increased restlessness, agitation, or irritability.  You are unable to recognize people or places.  You have neck pain.  It is difficult to wake you up.  You have unusual behavior changes.  You lose consciousness. MAKE SURE YOU:  Understand these instructions.  Will watch your condition.  Will get help right away if you are not doing well or get worse.   This information is not intended to replace advice given to you by your health care provider. Make sure you discuss any questions you have with your health care provider.   Document Released: 11/09/2003 Document Revised: 09/09/2014 Document Reviewed: 03/11/2013 Elsevier Interactive Patient Education 2016 [employer]  Eardrum Perforation An eardrum perforation is a  puncture or tear in the eardrum. This is also called a ruptured eardrum. The eardrum is a thin, round tissue inside of your ear that separates your ear canal from your middle ear. This is the tissue that detects sound and enables you to hear. An eardrum perforation can cause discomfort and hearing loss. In most cases, the eardrum will heal without treatment and with little or no permanent hearing loss. CAUSES An eardrum perforation can result from different causes, including:  Sudden pressure changes that happen in situations such as scuba diving or flying in an airplane.  Foreign objects in the ear.  Inserting a cotton-tipped swab or any blunt object into the ear.  Loud noise.  Trauma to the ear.  Attempting to remove an object from the ear. SIGNS AND SYMPTOMS  Hearing loss.  Ear pain.  Ringing in the ear.  Discharge or bleeding from the ear.  Dizziness.  Vomiting.  Facial paralysis. DIAGNOSIS  Your health care provider will  examine your ear using a tool called an otoscope. This tool allows the health care provider to see into your ear to examine your eardrum. Various types of hearing tests may also be done. TREATMENT  Typically, the eardrum will heal on its own within a few weeks. If your eardrum does not heal, your health care provider may recommend one of the following treatments:  A procedure to place a patch over your eardrum.  Surgery to repair your eardrum. HOME CARE INSTRUCTIONS   Keep your ear dry. This will improve healing. Do not submerge your head under water until healing is complete. Do not swim or dive until your health care provider approves. While bathing or showering, protect your ear using one of these methods:  Using a waterproof earplug.  Covering a piece of cotton with petroleum jelly and placing it in your outer ear canal.  Take medicines only as directed by your health care provider.  Avoid blowing your nose if possible. If you blow your nose,  do it gently. Forceful blowing increases the pressure in your middle ear. This may cause further injury or may delay your healing.  Resume your normal activities after the perforation has healed. Your health care provider can tell you when this has occurred.  Talk to your health care provider before you fly on an airplane. Air travel is generally allowed with a perforated eardrum.  Keep all follow-up visits as directed by your health care provider. This is important. SEEK MEDICAL CARE IF:  You have a fever. SEEK IMMEDIATE MEDICAL CARE IF:  You have blood or pus coming from your ear.  You have dizziness or problems with balance.  You have nausea or vomiting.  You have increased pain.   This information is not intended to replace advice given to you by your health care provider. Make sure you discuss any questions you have with your health care provider.   Document Released: 08/16/2000 Document Revised: 09/09/2014 Document Reviewed: 03/28/2014 Elsevier Interactive Patient Education [employer]
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
## 2016-10-08 ENCOUNTER — Encounter (HOSPITAL_BASED_OUTPATIENT_CLINIC_OR_DEPARTMENT_OTHER): Payer: Self-pay

## 2016-10-08 ENCOUNTER — Emergency Department (HOSPITAL_BASED_OUTPATIENT_CLINIC_OR_DEPARTMENT_OTHER): Payer: 59

## 2016-10-08 ENCOUNTER — Emergency Department (HOSPITAL_BASED_OUTPATIENT_CLINIC_OR_DEPARTMENT_OTHER)
Admission: EM | Admit: 2016-10-08 | Discharge: 2016-10-08 | Disposition: A | Discharge status: Home / Self Care | Payer: 59 | Attending: Emergency Medicine | Admitting: Emergency Medicine

## 2016-10-08 DIAGNOSIS — Z79899 Other long term (current) drug therapy: Secondary | ICD-10-CM | POA: Insufficient documentation

## 2016-10-08 DIAGNOSIS — Z87891 Personal history of nicotine dependence: Secondary | ICD-10-CM | POA: Insufficient documentation

## 2016-10-08 DIAGNOSIS — N200 Calculus of kidney: Secondary | ICD-10-CM | POA: Diagnosis not present

## 2016-10-08 DIAGNOSIS — R109 Unspecified abdominal pain: Secondary | ICD-10-CM | POA: Diagnosis present

## 2016-10-08 LAB — BASIC METABOLIC PANEL
Anion gap: 5 (ref 5–15)
BUN: 13 mg/dL (ref 6–20)
CO2: 26 mmol/L (ref 22–32)
CREATININE: 1.08 mg/dL — AB (ref 0.44–1.00)
Calcium: 9.2 mg/dL (ref 8.9–10.3)
Chloride: 108 mmol/L (ref 101–111)
Glucose, Bld: 82 mg/dL (ref 65–99)
Potassium: 4 mmol/L (ref 3.5–5.1)
SODIUM: 139 mmol/L (ref 135–145)

## 2016-10-08 LAB — URINALYSIS, MICROSCOPIC (REFLEX)

## 2016-10-08 LAB — URINALYSIS, ROUTINE W REFLEX MICROSCOPIC
Glucose, UA: NEGATIVE mg/dL
KETONES UR: NEGATIVE mg/dL
LEUKOCYTES UA: NEGATIVE
NITRITE: NEGATIVE
Protein, ur: 30 mg/dL — AB
SPECIFIC GRAVITY, URINE: 1.039 — AB (ref 1.005–1.030)
pH: 6.5 (ref 5.0–8.0)

## 2016-10-08 LAB — PREGNANCY, URINE: Preg Test, Ur: NEGATIVE

## 2016-10-08 MED ORDER — ONDANSETRON 4 MG PO TBDP: ORAL_TABLET | ORAL | 0 refills | Status: AC

## 2016-10-08 MED ORDER — OXYCODONE-ACETAMINOPHEN 5-325 MG PO TABS: 1.0000 | ORAL_TABLET | Freq: Three times a day (TID) | ORAL | 0 refills | Status: AC | PRN

## 2016-10-08 MED ORDER — KETOROLAC TROMETHAMINE 30 MG/ML IJ SOLN
30.0000 mg | Freq: Once | INTRAMUSCULAR | Status: AC
  Administered 2016-10-08: 30 mg via INTRAVENOUS
  Filled 2016-10-08: qty 1

## 2016-10-08 MED ORDER — PROMETHAZINE HCL 25 MG/ML IJ SOLN
25.0000 mg | Freq: Once | INTRAMUSCULAR | Status: AC
  Administered 2016-10-08: 25 mg via INTRAVENOUS
  Filled 2016-10-08: qty 1

## 2016-10-08 MED ORDER — SODIUM CHLORIDE 0.9 % IV BOLUS (SEPSIS)
1000.0000 mL | Freq: Once | INTRAVENOUS | Status: AC
  Administered 2016-10-08: 1000 mL via INTRAVENOUS

## 2016-10-08 NOTE — ED Provider Notes (Signed)
[facility] Note   CSN: [id number] Arrival date & time: 10/08/16  1553   By signing my name below, I, Alisha Garcia, attest that this documentation has been prepared under the direction and in the presence of Alisha Morn, NP Electronically Signed: Soijett Garcia, ED Scribe. 10/08/16. 5:24 PM.  History   Chief Complaint Chief Complaint  Patient presents with  . Flank Pain    HPI Alisha Garcia is a 26 y.o. female with a PMHx of kidney stones, who presents to the Emergency Department complaining of intermittent, sudden onset, left flank pain onset earlier today. Pt reports associated nausea and vomiting x 5 episodes. Pt has tried zofran, toradol with her last dose being at noon, and 5-325 mg oxycodone with her last dose being at 2 PM with no relief of her symptoms. 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 Pt states that she was evaluated in the ED 11 months ago due to ovarian cyst and it was found that she had a 4 mm kidney stone with no issues until today. She denies fever, chills, abdominal pain, and any other symptoms.    The history is provided by the patient and a friend. No language interpreter was used.  Flank Pain  This is a new problem. The current episode started 6 to 12 hours ago. The problem occurs rarely. The problem has not changed since onset.Pertinent negatives include no abdominal pain. The symptoms are aggravated by bending. Nothing relieves the symptoms. Treatments tried: zofran, oxycodone, and toradol. The treatment provided no relief.    Past Medical History:  Diagnosis Date  . Kidney stones   . Ovarian cyst 2012    There are no active problems to display for this patient.   Past Surgical History:  Procedure Laterality Date  . OVARIAN CYST DRAINAGE      OB History    No data available       Home Medications    Prior to Admission medications   Medication Sig Start Date End  Date Taking? Authorizing Provider  oxyCODONE-acetaminophen (PERCOCET/ROXICET) 5-325 MG tablet Take by mouth every 4 (four) hours as needed for severe pain.   Yes Historical Provider, MD  ondansetron (ZOFRAN ODT) 4 MG disintegrating tablet Take 1 tablet (4 mg total) by mouth every 8 (eight) hours as needed for nausea or vomiting. 03/14/16   [name], PA-C    Family History No family history on file.  Social History Social History  Substance Use Topics  . Smoking status: Former Games developer  . Smokeless tobacco: Never Used  . Alcohol use Yes     Comment: occasional     Allergies   Other   Review of Systems Review of Systems  Constitutional: Negative for chills and fever.  Gastrointestinal: Positive for nausea and vomiting. Negative for abdominal pain.  Genitourinary: Positive for flank pain (left).  All other systems reviewed and are negative.    Physical Exam Updated Vital Signs BP 121/75 (BP Location: Left Arm)   Pulse 63   Temp 97.9 F (36.6 C) (Oral)   Resp 22   Ht 5' (1.524 m)   Wt 121 lb (54.9 kg)   LMP 09/24/2016   SpO2 100%   BMI 23.63 kg/m   Physical Exam  Constitutional: She is oriented to person, place, and time. She appears well-developed and well-nourished. No distress.  HENT:  Head: Normocephalic and atraumatic.  Eyes: EOM are normal.  Neck: Neck supple.  Cardiovascular: Normal rate, regular rhythm and normal heart sounds.  Exam reveals no gallop and no friction rub.   No murmur heard. Pulmonary/Chest: Effort normal and breath sounds normal. No respiratory distress. She has no wheezes. She has no rales.  Abdominal: Soft. She exhibits no distension. There is CVA tenderness.  Left CVA tenderness  Musculoskeletal: Normal range of motion.  Neurological: She is alert and oriented to person, place, and time.  Skin: Skin is warm and dry.  Psychiatric: She has a normal mood and affect. Her behavior is normal.  Nursing note and vitals reviewed.    ED  Treatments / Results  DIAGNOSTIC STUDIES: Oxygen Saturation is 100% on RA, nl by my interpretation.    COORDINATION OF CARE: 5:22 PM Discussed treatment plan with pt at bedside which includes labs, UA, US renal, and pt agreed to plan.    Labs (all labs ordered are listed, but only abnormal results are displayed) Labs Reviewed  URINALYSIS, ROUTINE W REFLEX MICROSCOPIC - Abnormal; Notable for the following:       Result Value   Color, Urine AMBER (*)    APPearance CLOUDY (*)    Specific Gravity, Urine 1.039 (*)    Hgb urine dipstick LARGE (*)    Bilirubin Urine SMALL (*)    Protein, ur 30 (*)    All other components within normal limits  URINALYSIS, MICROSCOPIC (REFLEX) - Abnormal; Notable for the following:    Bacteria, UA RARE (*)    Squamous Epithelial / LPF 0-5 (*)    All other components within normal limits  BASIC METABOLIC PANEL - Abnormal; Notable for the following:    Creatinine, Ser 1.08 (*)    All other components within normal limits  PREGNANCY, URINE     Radiology [place] Renal  Result Date: 10/08/2016 CLINICAL DATA:  Sudden onset left flank pain EXAM: RENAL / URINARY TRACT ULTRASOUND COMPLETE COMPARISON:  11/11/2015 FINDINGS: Right Kidney: Length: 10.5 cm. Echogenicity within normal limits. No mass or hydronephrosis visualized. Left Kidney: Length: 11.0 cm. Mild left hydronephrosis. Normal echotexture. No mass. Bladder: 5 mm stone noted posterior to the left bladder wall, likely at the left ureterovesical junction. Bladder wall unremarkable. IMPRESSION: Mild left hydronephrosis, with probable 5 mm left UVJ stone. Electronically Signed   By: Charlett NoseKevin  Dover M.D.   On: 10/08/2016 18:36    Procedures Procedures (including critical care time)  Medications Ordered in ED Medications  sodium chloride 0.9 % bolus 1,000 mL (0 mLs Intravenous Stopped 10/08/16 1840)  ketorolac (TORADOL) 30 MG/ML injection 30 mg (30 mg Intravenous Given 10/08/16 1754)  promethazine (PHENERGAN)  injection 25 mg (25 mg Intravenous Given 10/08/16 1750)     Initial Impression / Assessment and Plan / ED Course  I have reviewed the triage vital signs and the nursing notes.  Pertinent labs & imaging results that were available during my care of the patient were reviewed by me and considered in my medical decision making (see chart for details).    Pt has been diagnosed with a Kidney Stone via CT. There is no evidence of significant hydronephrosis, serum creatine WNL, vitals sign stable and the pt does not have irratractable vomiting. Pt will be dc home with pain medications & has been advised to follow up with PCP/urology.     Final Clinical Impressions(s) / ED Diagnoses   Final diagnoses:  Nephrolithiasis    New Prescriptions Discharge Medication List as of 10/08/2016  7:37 PM  I personally performed the services described in this documentation, which was scribed in my presence. The recorded information has been reviewed and is accurate.'    Alisha Morn, NP 10/09/16 0111    Charlynne Pander, MD 10/11/16 [phone]

## 2016-10-08 NOTE — ED Triage Notes (Signed)
C/o left flank pain, n/v-pt states she took zofran, toradol, oxydrocodone PTA-slow steady gait

## 2016-10-08 NOTE — ED Triage Notes (Signed)
Pt here with c/o back pain, ?kidney stone

## 2017-03-27 ENCOUNTER — Emergency Department
Admission: EM | Admit: 2017-03-27 | Discharge: 2017-03-27 | Disposition: A | Discharge status: Home / Self Care | Payer: 59 | Source: Home / Self Care | Attending: Family Medicine | Admitting: Family Medicine

## 2017-03-27 DIAGNOSIS — T23212A Burn of second degree of left thumb (nail), initial encounter: Secondary | ICD-10-CM | POA: Diagnosis not present

## 2017-03-27 DIAGNOSIS — T23131A Burn of first degree of multiple right fingers (nail), not including thumb, initial encounter: Secondary | ICD-10-CM | POA: Diagnosis not present

## 2017-03-27 HISTORY — DX: Depression, unspecified: F32.A

## 2017-03-27 HISTORY — DX: Major depressive disorder, single episode, unspecified: F32.9

## 2017-03-27 HISTORY — DX: Torsion of ovary and ovarian pedicle, unspecified side: N83.519

## 2017-03-27 MED ORDER — HYDROCODONE-ACETAMINOPHEN 5-325 MG PO TABS: 1.0000 | ORAL_TABLET | Freq: Four times a day (QID) | ORAL | 0 refills | Status: AC | PRN

## 2017-03-27 MED ORDER — KETOROLAC TROMETHAMINE 60 MG/2ML IM SOLN
60.0000 mg | Freq: Once | INTRAMUSCULAR | Status: AC
  Administered 2017-03-27: 60 mg via INTRAMUSCULAR

## 2017-03-27 MED ORDER — SILVER SULFADIAZINE 1 % EX CREA: TOPICAL_CREAM | CUTANEOUS | 0 refills | Status: AC

## 2017-03-27 NOTE — Discharge Instructions (Signed)
Apply ice pack for 10 to 15 minutes, 3 to 4 times daily  Continue until pain and swelling decrease.  May take Ibuprofen 200mg , 4 tabs every 8 hours with food.  Change dressing daily until healed.  Keep wounds clean and dry.  Leave Mepelex dressing in place for 2 days.
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
## 2017-09-11 IMAGING — CT CT HEAD W/O CM
4 series · 16 of 47 positions shown, 18 images · non-contrast
Comparison: None.

CLINICAL DATA: Fall yesterday, hit left side head

EXAM:
CT HEAD WITHOUT CONTRAST
TECHNIQUE: Contiguous axial images were obtained from the base of the skull
through the vertex without intravenous contrast.

[Series 2: head without · axial · non-contrast · 0.40mm/px · z∈[-10,+95]mm · 7 of 29 slices shown, 9 images]
[im 4/29  brain]
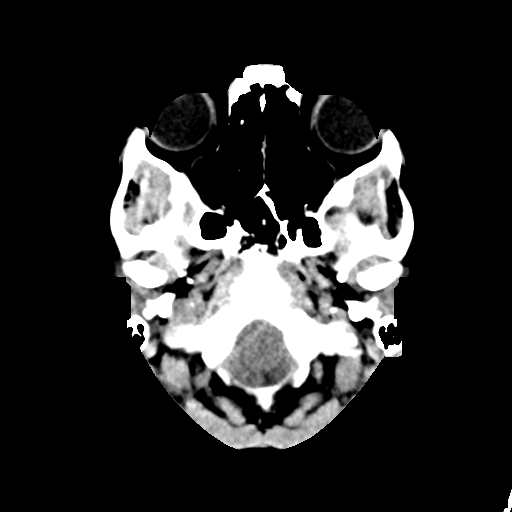
[im 4/29  bone]
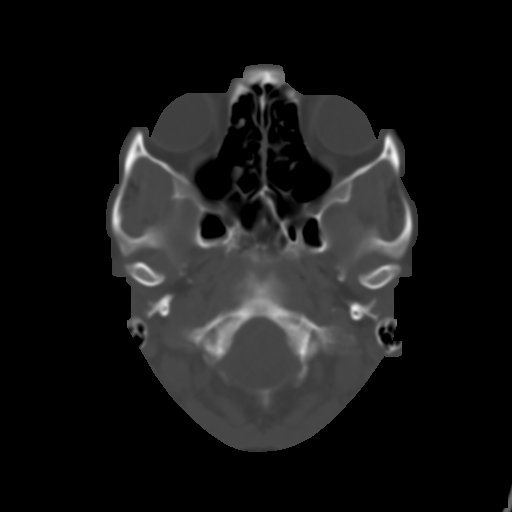
[im 8/29  brain]
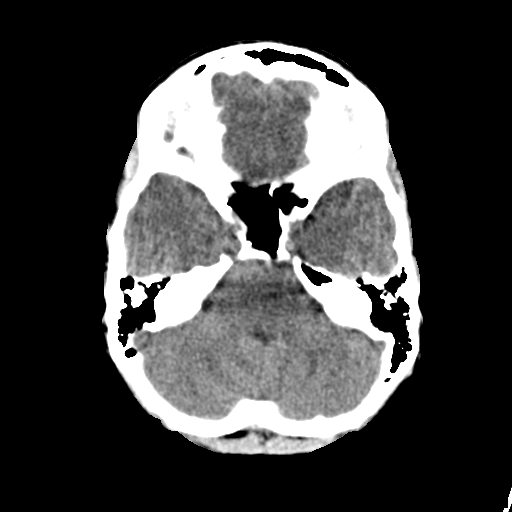
[im 11/29  brain]
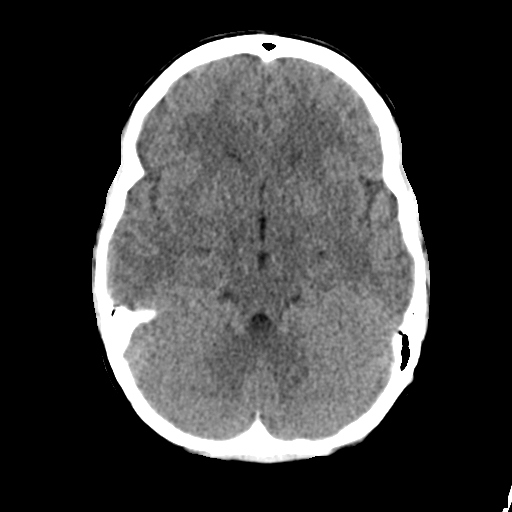
[im 15/29  brain]
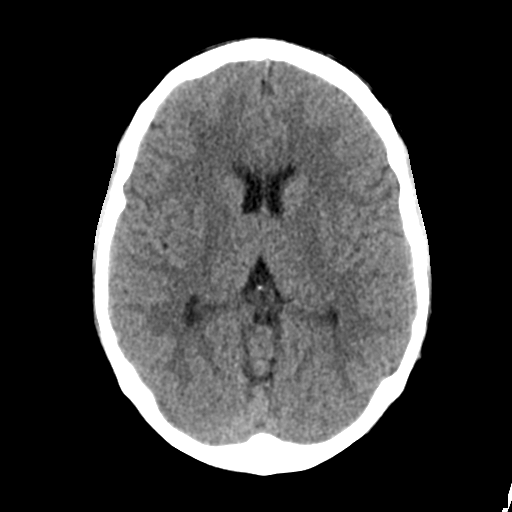
[im 18/29  brain]
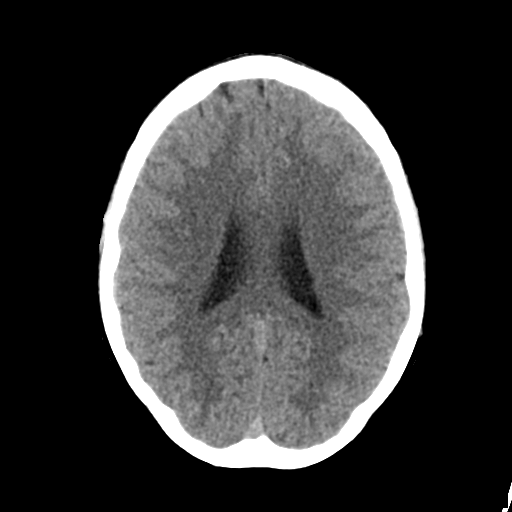
[im 18/29  bone]
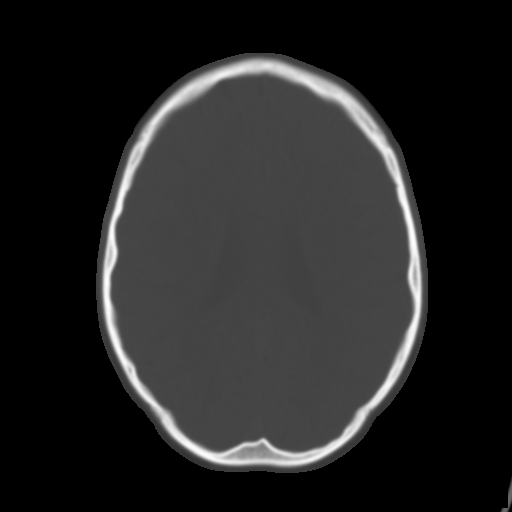
[im 22/29  brain]
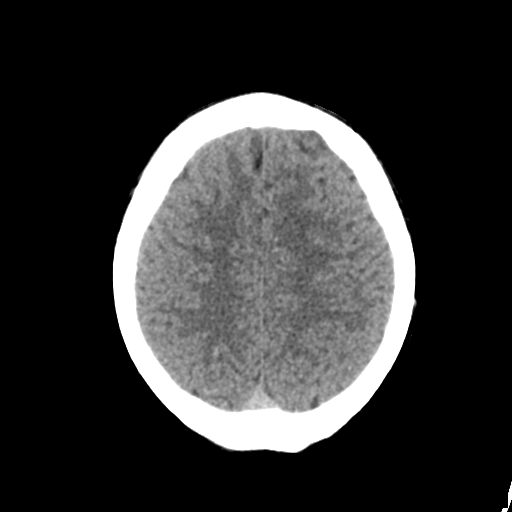
[im 25/29  brain]
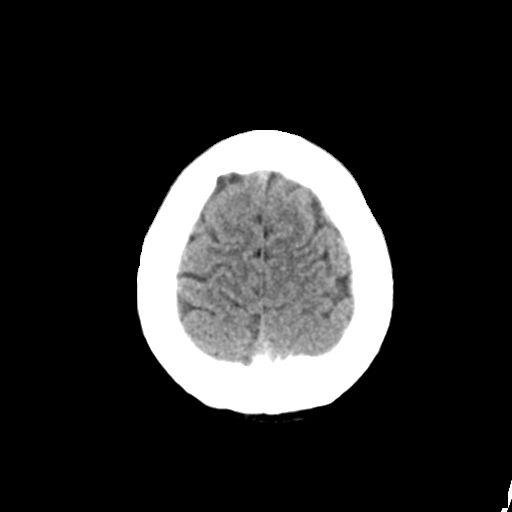

[Series 3: head bone · axial · 0.40mm/px · z∈[-11,+17]mm · 3 of 72 slices shown]
[im 8/72  bone]
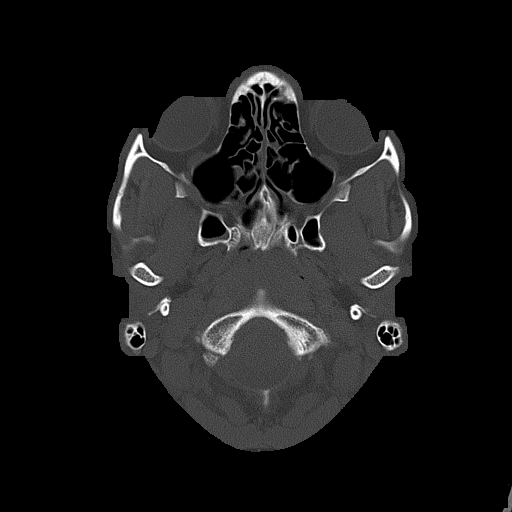
[im 15/72  bone]
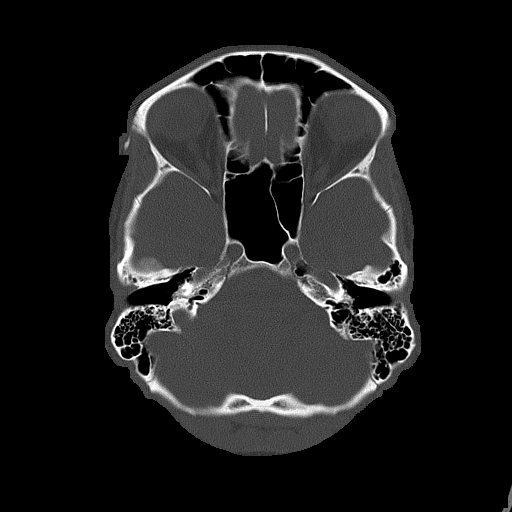
[im 22/72  bone]
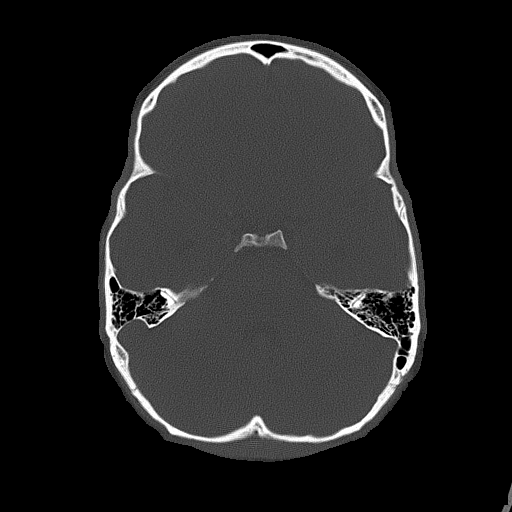

[Series 4: head without cor · coronal · non-contrast · 0.28mm/px · 3 of 65 slices shown]
[im 22/65  brain]
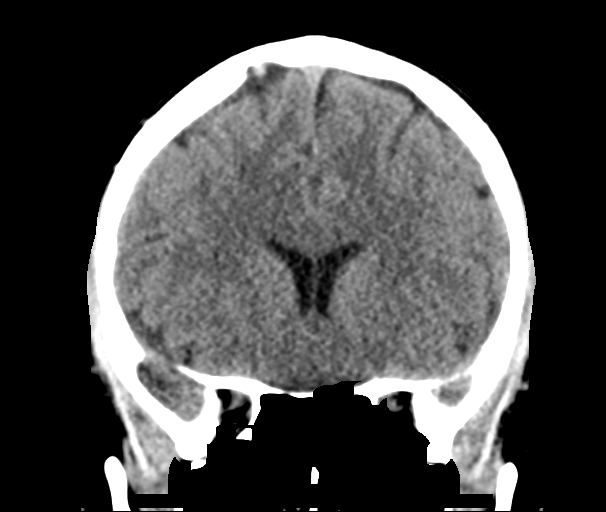
[im 29/65  brain]
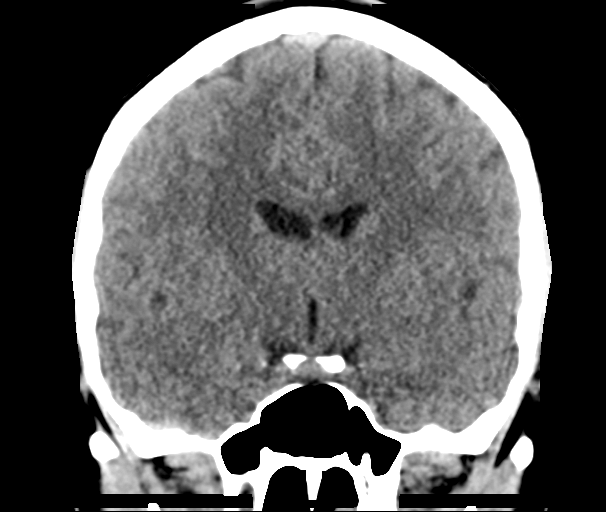
[im 36/65  brain]
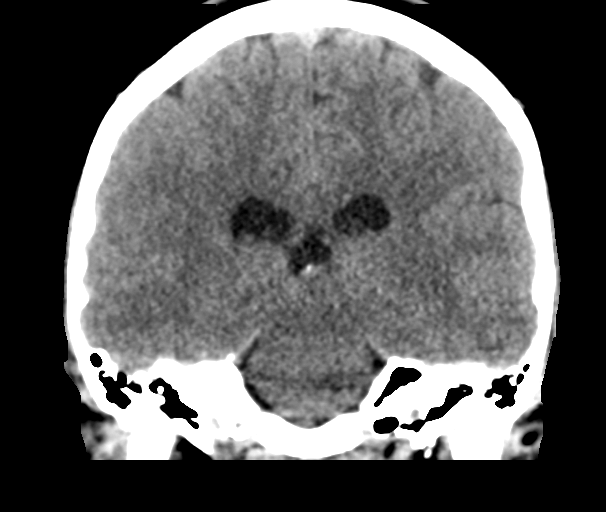

[Series 5: head without sag · sagittal · non-contrast · 0.28mm/px · 3 of 56 slices shown]
[im 19/56  brain]
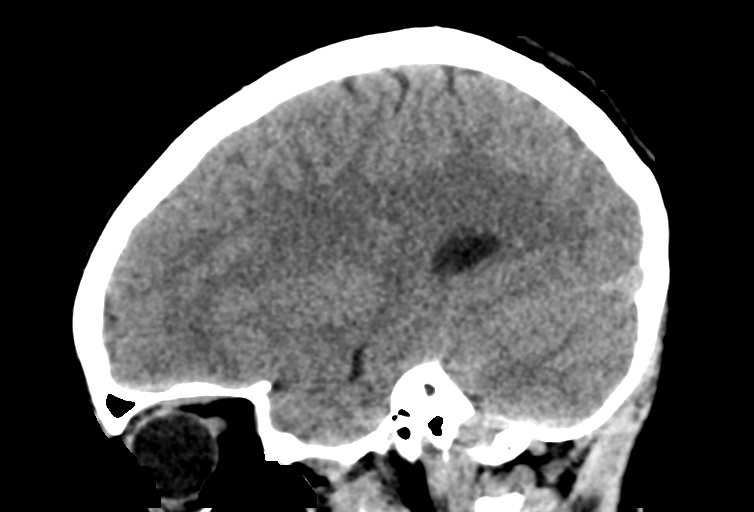
[im 28/56  brain]
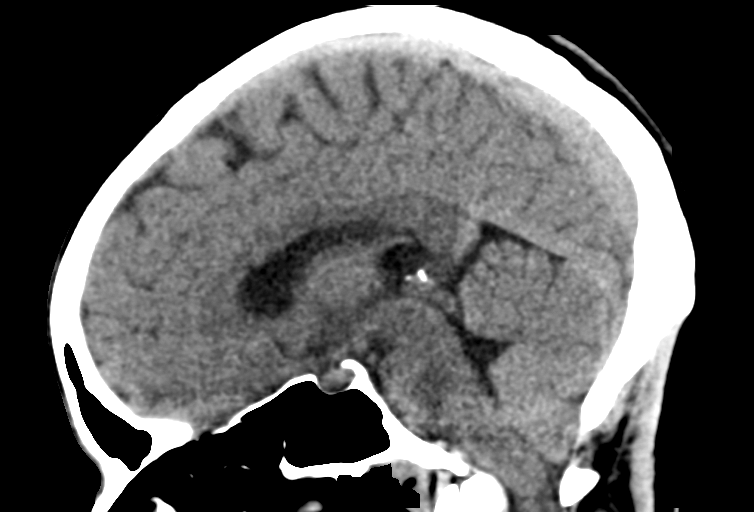
[im 37/56  brain]
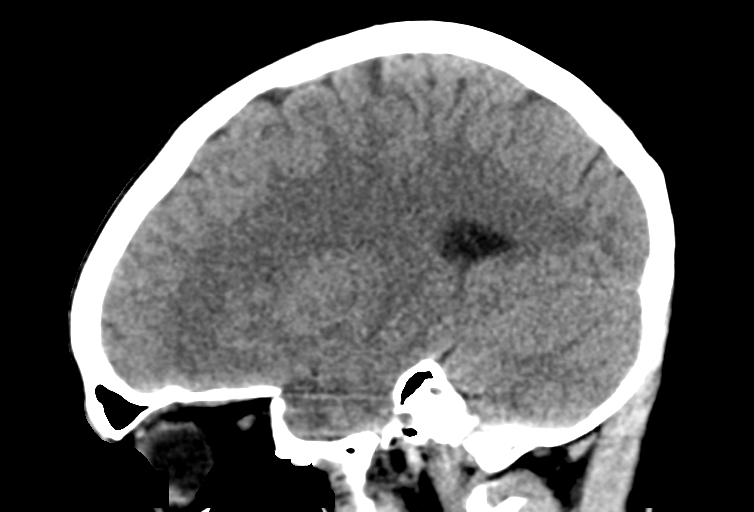

[16 of 47 positions shown; findings below may reference images not displayed]

FINDINGS: No skull fracture is noted.

Paranasal sinuses shows minimal mucosal thickening right maxillary
sinus. The mastoid air cells are unremarkable.

No intracranial hemorrhage, mass effect or midline shift.

No acute infarction. No mass lesion is noted on this unenhanced
scan.

No hydrocephalus.  No intra or extra-axial fluid collection.
IMPRESSION: No acute intracranial abnormality.

## 2018-10-24 IMAGING — US US RENAL
1 series · 14 of 25 positions shown · non-contrast
Comparison: 11/11/2015

CLINICAL DATA: Sudden onset left flank pain

EXAM:
RENAL / URINARY TRACT ULTRASOUND COMPLETE

[Series 1: us renal · 0.17mm/px · 14 of 64 slices shown]
[im 1/64]
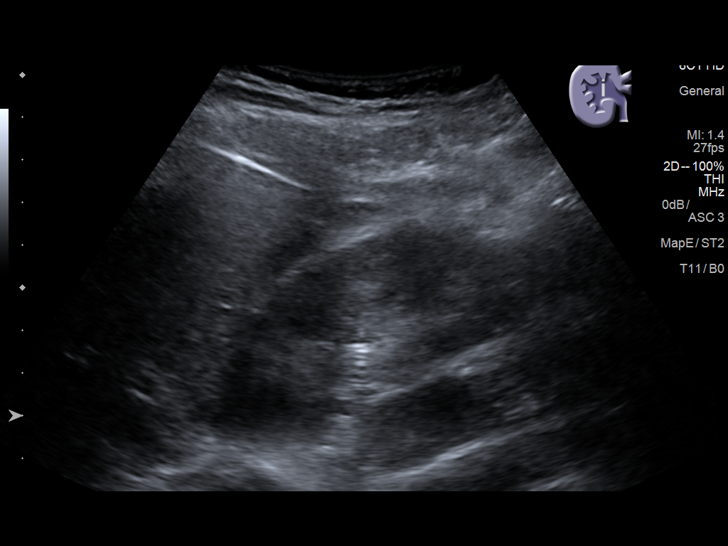
[im 6/64]
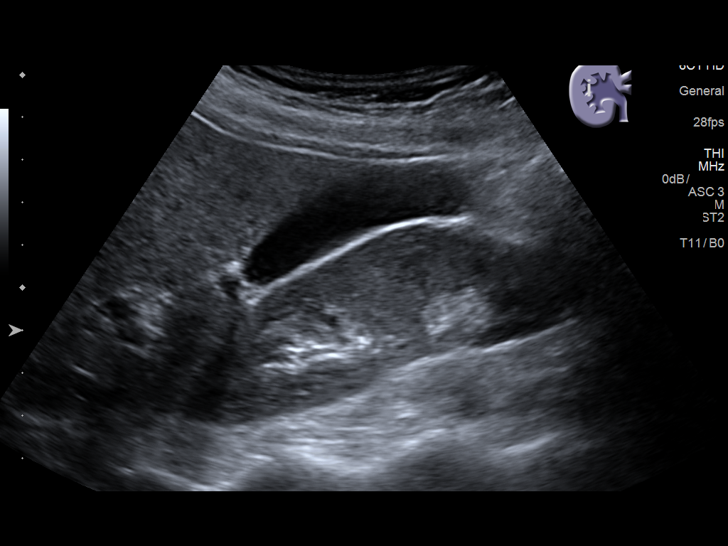
[im 11/64]
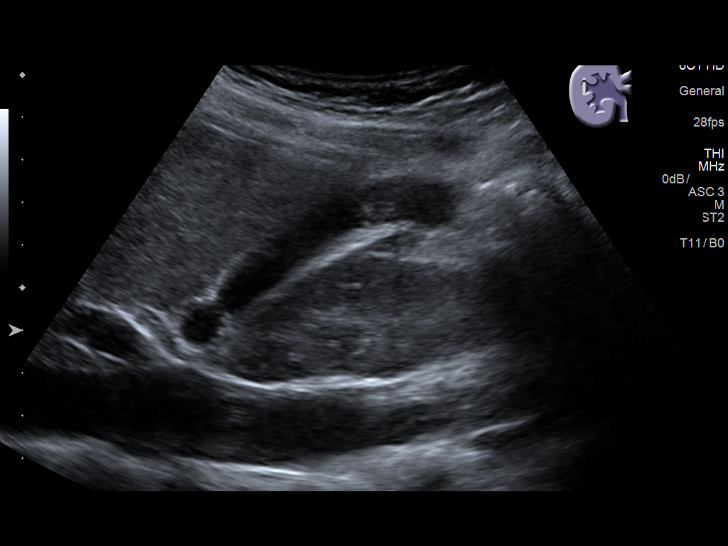
[im 16/64]
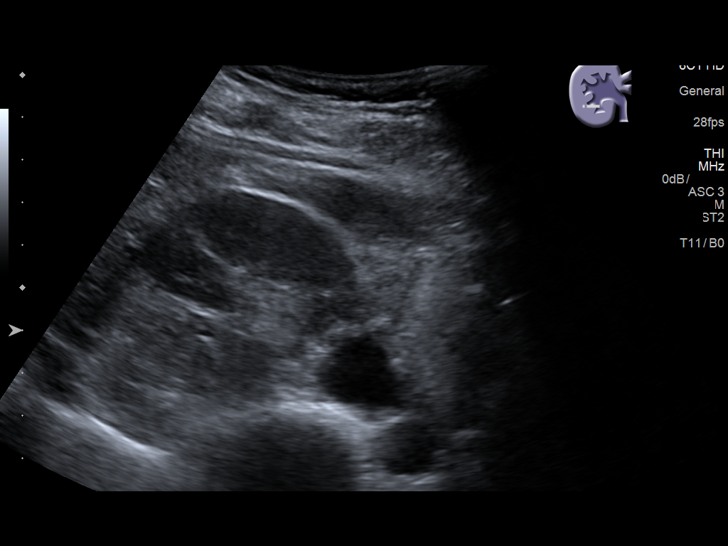
[im 22/64]
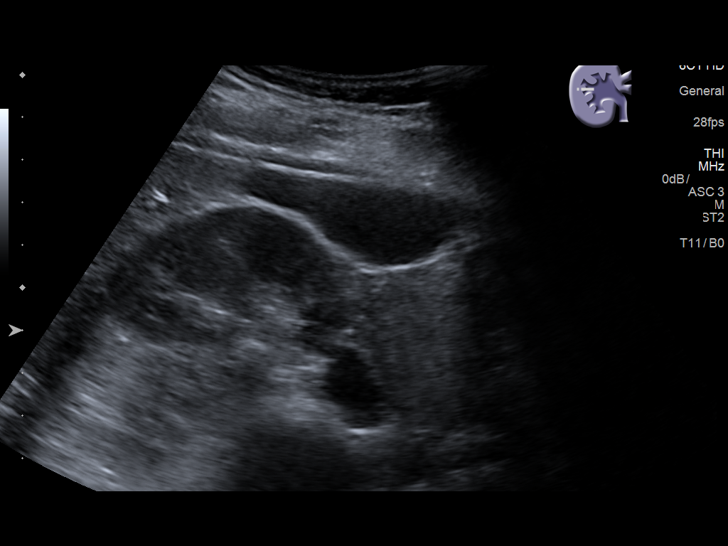
[im 24/64]
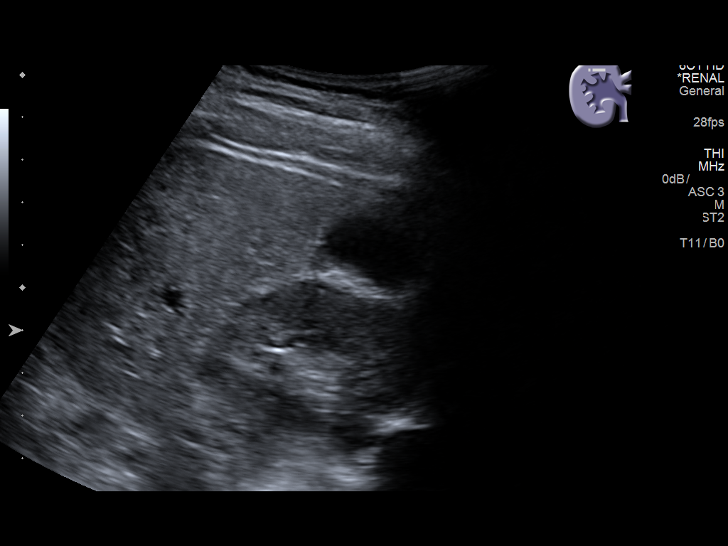
[im 29/64]
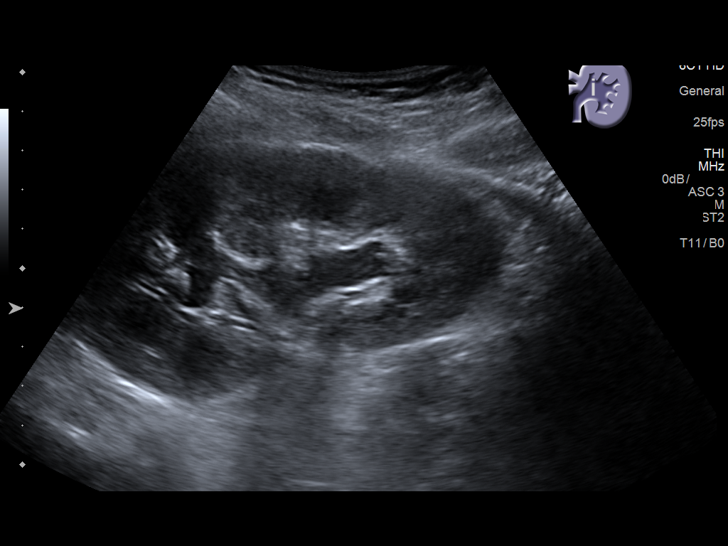
[im 35/64]
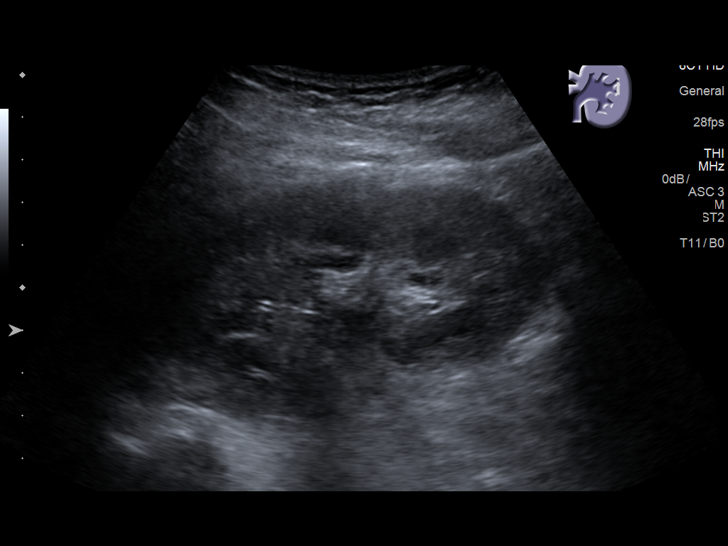
[im 40/64]
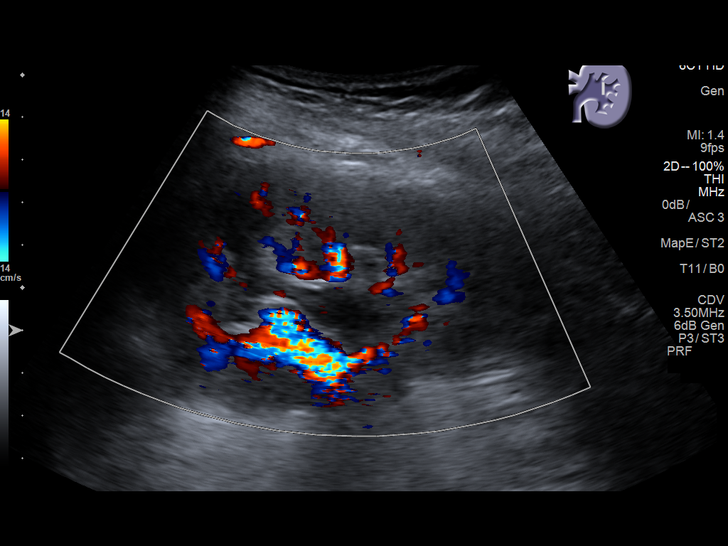
[im 43/64]
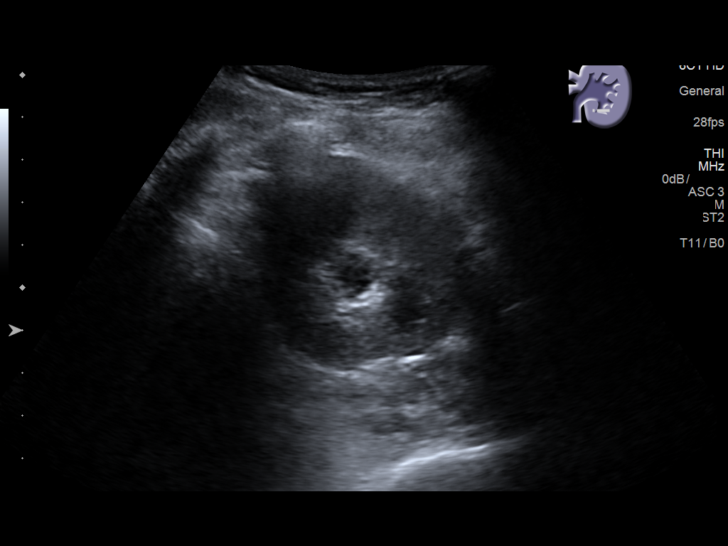
[im 48/64]
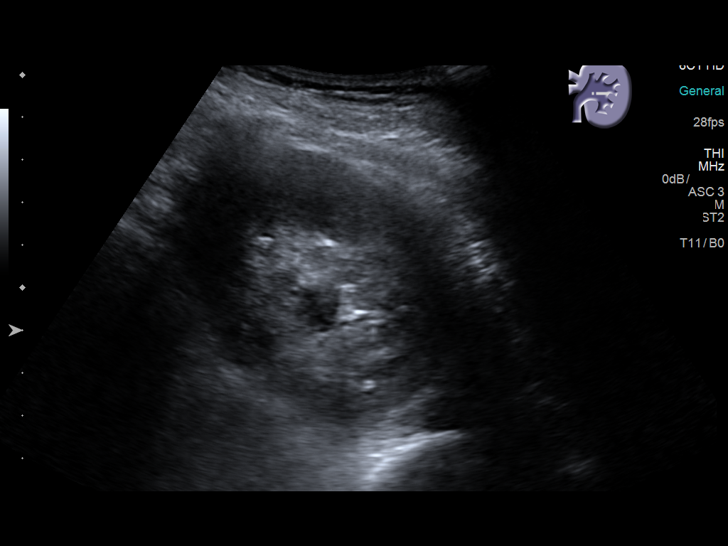
[im 53/64]
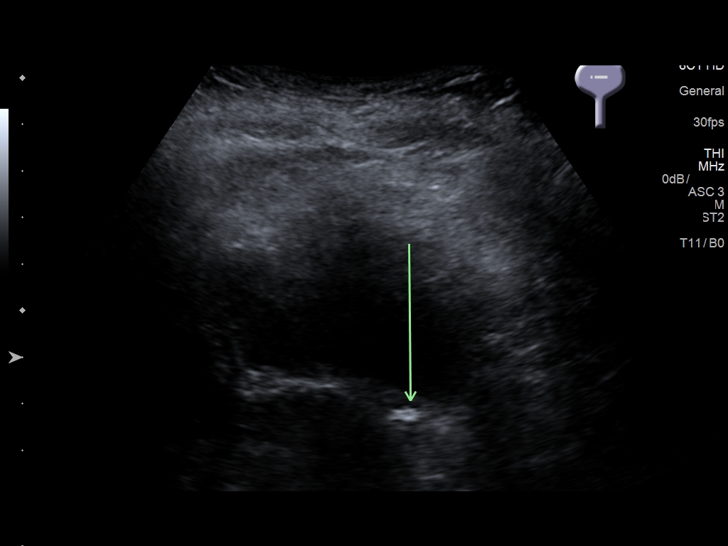
[im 58/64]
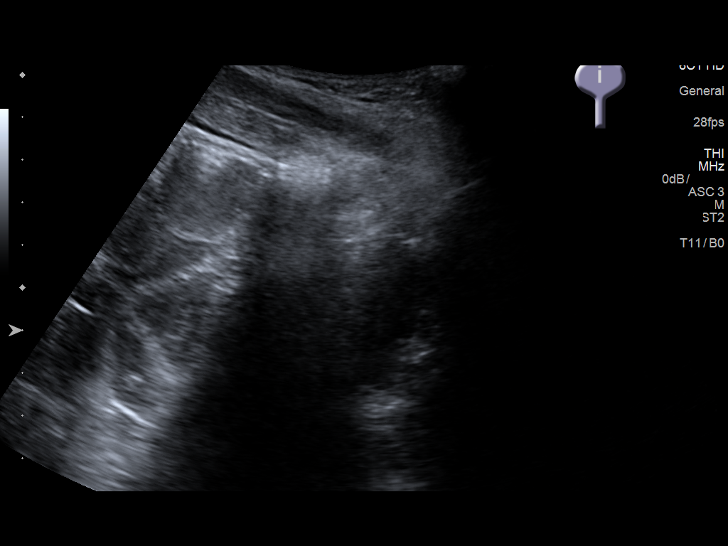
[im 64/64]
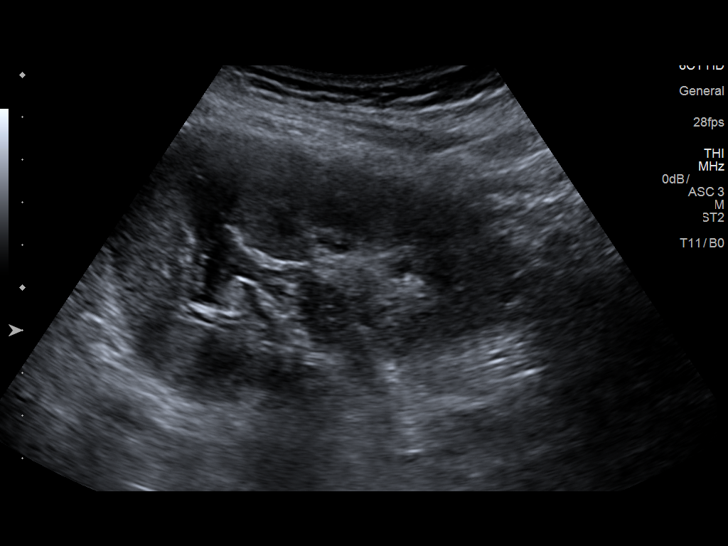

[14 of 25 positions shown; findings below may reference images not displayed]

FINDINGS: Right Kidney:

Length: 10.5 cm. Echogenicity within normal limits. No mass or
hydronephrosis visualized.

Left Kidney:

Length: 11.0 cm. Mild left hydronephrosis. Normal echotexture. No
mass.

Bladder:

5 mm stone noted posterior to the left bladder wall, likely at the
left ureterovesical junction. Bladder wall unremarkable.
IMPRESSION: Mild left hydronephrosis, with probable 5 mm left UVJ stone.
